# Patient Record
Sex: Male | Born: 1990 | Race: White | Hispanic: No | Marital: Married | Smoking: Never smoker
Health system: Southern US, Community
[De-identification: ages and names within clinical notes are randomized; demographics above are authoritative.]

## PROBLEM LIST (undated history)

## (undated) DIAGNOSIS — N200 Calculus of kidney: Secondary | ICD-10-CM

## (undated) DIAGNOSIS — E162 Hypoglycemia, unspecified: Secondary | ICD-10-CM

## (undated) DIAGNOSIS — R29898 Other symptoms and signs involving the musculoskeletal system: Secondary | ICD-10-CM

## (undated) DIAGNOSIS — K219 Gastro-esophageal reflux disease without esophagitis: Secondary | ICD-10-CM

## (undated) DIAGNOSIS — F419 Anxiety disorder, unspecified: Secondary | ICD-10-CM

## (undated) HISTORY — DX: Calculus of kidney: N20.0

## (undated) HISTORY — PX: VASECTOMY: SHX75

---

## 2010-08-06 ENCOUNTER — Emergency Department (HOSPITAL_COMMUNITY)
Admission: EM | Admit: 2010-08-06 | Discharge: 2010-08-06 | Disposition: A | Discharge status: Home / Self Care | Payer: Self-pay | Attending: Emergency Medicine | Admitting: Emergency Medicine

## 2010-08-06 ENCOUNTER — Emergency Department (HOSPITAL_COMMUNITY): Payer: Self-pay

## 2010-08-06 DIAGNOSIS — R509 Fever, unspecified: Secondary | ICD-10-CM | POA: Insufficient documentation

## 2010-08-06 DIAGNOSIS — R059 Cough, unspecified: Secondary | ICD-10-CM | POA: Insufficient documentation

## 2010-08-06 DIAGNOSIS — R05 Cough: Secondary | ICD-10-CM | POA: Insufficient documentation

## 2010-08-06 DIAGNOSIS — J189 Pneumonia, unspecified organism: Secondary | ICD-10-CM | POA: Insufficient documentation

## 2010-08-06 LAB — RAPID STREP SCREEN (MED CTR MEBANE ONLY): Streptococcus, Group A Screen (Direct): NEGATIVE

## 2010-11-12 ENCOUNTER — Emergency Department (HOSPITAL_COMMUNITY)
Admission: EM | Admit: 2010-11-12 | Discharge: 2010-11-12 | Disposition: A | Discharge status: Home / Self Care | Payer: Self-pay | Attending: Emergency Medicine | Admitting: Emergency Medicine

## 2010-11-12 DIAGNOSIS — J189 Pneumonia, unspecified organism: Secondary | ICD-10-CM | POA: Insufficient documentation

## 2010-12-11 ENCOUNTER — Emergency Department (HOSPITAL_COMMUNITY)
Admission: EM | Admit: 2010-12-11 | Discharge: 2010-12-11 | Disposition: A | Discharge status: Home / Self Care | Payer: Self-pay | Attending: Emergency Medicine | Admitting: Emergency Medicine

## 2010-12-11 DIAGNOSIS — T148XXA Other injury of unspecified body region, initial encounter: Secondary | ICD-10-CM

## 2010-12-11 DIAGNOSIS — X500XXA Overexertion from strenuous movement or load, initial encounter: Secondary | ICD-10-CM | POA: Insufficient documentation

## 2010-12-11 DIAGNOSIS — S139XXA Sprain of joints and ligaments of unspecified parts of neck, initial encounter: Secondary | ICD-10-CM | POA: Insufficient documentation

## 2010-12-11 DIAGNOSIS — Y9289 Other specified places as the place of occurrence of the external cause: Secondary | ICD-10-CM | POA: Insufficient documentation

## 2010-12-11 MED ORDER — NAPROXEN 500 MG PO TABS: 500.0000 mg | ORAL_TABLET | Freq: Two times a day (BID) | ORAL | Status: AC

## 2010-12-11 MED ORDER — CYCLOBENZAPRINE HCL 10 MG PO TABS: 10.0000 mg | ORAL_TABLET | Freq: Three times a day (TID) | ORAL | Status: AC | PRN

## 2010-12-11 NOTE — ED Provider Notes (Addendum)
History     Chief Complaint  Patient presents with  . Torticollis   HPI Comments: Pt reports waking up with a crick in his neck Sunday morning (2 days ago), later that day in the pool pt lifted up his girlfriend and has had increased right sided neck pain since. No direct injury to neck. No ha, back pain, numbness, tingling, or weakness. No fall or head injury. Pt has no other complaints.  Patient is a 20 y.o. male presenting with neck injury. The history is provided by the patient.  Neck Injury This is a new problem. The current episode started 2 days ago. The problem occurs constantly. The problem has not changed since onset.Pertinent negatives include no chest pain, no abdominal pain, no headaches and no shortness of breath. The symptoms are aggravated by twisting (twisting neck). The symptoms are relieved by nothing. Treatments tried: ibuprofen/advil. The treatment provided no relief.    History reviewed. No pertinent past medical history.  History reviewed. No pertinent past surgical history.  No family history on file.  History  Substance Use Topics  . Smoking status: Never Smoker   . Smokeless tobacco: Not on file  . Alcohol Use: No      Review of Systems  Constitutional: Negative for fatigue.  HENT: Negative for congestion, sinus pressure and ear discharge.   Eyes: Negative for discharge.  Respiratory: Negative for cough and shortness of breath.   Cardiovascular: Negative for chest pain.  Gastrointestinal: Negative for abdominal pain and diarrhea.  Genitourinary: Negative for frequency and hematuria.  Musculoskeletal: Negative for back pain.  Skin: Negative for rash.  Neurological: Negative for seizures and headaches.  Hematological: Negative.   Psychiatric/Behavioral: Negative for hallucinations.    Physical Exam  BP 138/77  Pulse 76  Temp(Src) 98.2 F (36.8 C) (Oral)  Resp 20  Ht 5\' 11"  (1.803 m)  Wt 150 lb (68.04 kg)  BMI 20.92 kg/m2  SpO2  100%  Physical Exam  Constitutional: He is oriented to person, place, and time. He appears well-developed.  HENT:  Head: Normocephalic and atraumatic.  Eyes: Conjunctivae and EOM are normal. No scleral icterus.  Neck: Normal range of motion. Neck supple. No thyromegaly present.       Right paraspinal and right trapezius tenderness, mild; FROM; no midline tenderness cervical, thoracic, or lumbar  Cardiovascular: Normal rate and regular rhythm.  Exam reveals no gallop and no friction rub.   No murmur heard. Pulmonary/Chest: No stridor. He has no wheezes. He has no rales. He exhibits no tenderness.  Abdominal: He exhibits no distension. There is no tenderness. There is no rebound.  Musculoskeletal: Normal range of motion. He exhibits no edema.  Lymphadenopathy:    He has no cervical adenopathy.  Neurological: He is oriented to person, place, and time. Coordination normal.  Skin: No rash noted. No erythema.  Psychiatric: He has a normal mood and affect. His behavior is normal.    ED Course  Procedures Written by Enos Fling acting as scribe for Dr. Estell Harpin.  MDM Muscular neck pain, d/c with muscle relaxants      Benny Lennert, MD 12/11/10 1323     I personally performed the services described in this documentation, which was scribed in my presence. The recorded information has been reviewed and considered. No att. providers found    Benny Lennert, MD 01/18/11 1408

## 2010-12-11 NOTE — ED Notes (Signed)
Pt reports waking up Sunday a.m with a "crick" in his neck.  Pt states that it worsened after lifting his girlfriend out of the pool.  Pt has limited movement left/right and flexion/extension.  Pt has been taking ibuprofen w/out relief.  CNS intact, all fingers bilaterally.  nad noted

## 2010-12-11 NOTE — ED Notes (Signed)
C/o neck pain after picking his girlfriend up out of the pool pain worse today. Occurred on Sunday. nad noted.

## 2016-10-24 ENCOUNTER — Emergency Department (HOSPITAL_COMMUNITY): Payer: No Typology Code available for payment source

## 2016-10-24 ENCOUNTER — Encounter (HOSPITAL_COMMUNITY): Payer: Self-pay

## 2016-10-24 ENCOUNTER — Emergency Department (HOSPITAL_COMMUNITY)
Admission: EM | Admit: 2016-10-24 | Discharge: 2016-10-24 | Disposition: A | Discharge status: Home / Self Care | Payer: No Typology Code available for payment source | Attending: Emergency Medicine | Admitting: Emergency Medicine

## 2016-10-24 DIAGNOSIS — R251 Tremor, unspecified: Secondary | ICD-10-CM | POA: Diagnosis present

## 2016-10-24 DIAGNOSIS — Z5181 Encounter for therapeutic drug level monitoring: Secondary | ICD-10-CM | POA: Diagnosis not present

## 2016-10-24 DIAGNOSIS — R079 Chest pain, unspecified: Secondary | ICD-10-CM | POA: Diagnosis not present

## 2016-10-24 DIAGNOSIS — F419 Anxiety disorder, unspecified: Secondary | ICD-10-CM | POA: Diagnosis not present

## 2016-10-24 DIAGNOSIS — R531 Weakness: Secondary | ICD-10-CM

## 2016-10-24 HISTORY — DX: Hypoglycemia, unspecified: E16.2

## 2016-10-24 HISTORY — DX: Anxiety disorder, unspecified: F41.9

## 2016-10-24 HISTORY — DX: Gastro-esophageal reflux disease without esophagitis: K21.9

## 2016-10-24 LAB — RAPID URINE DRUG SCREEN, HOSP PERFORMED
AMPHETAMINES: NOT DETECTED
BARBITURATES: NOT DETECTED
BENZODIAZEPINES: NOT DETECTED
Cocaine: NOT DETECTED
Opiates: NOT DETECTED
Tetrahydrocannabinol: NOT DETECTED

## 2016-10-24 LAB — CBC WITH DIFFERENTIAL/PLATELET
BASOS ABS: 0.1 10*3/uL (ref 0.0–0.1)
Basophils Relative: 1 %
Eosinophils Absolute: 0.1 10*3/uL (ref 0.0–0.7)
Eosinophils Relative: 1 %
HEMATOCRIT: 44.3 % (ref 39.0–52.0)
Hemoglobin: 15.4 g/dL (ref 13.0–17.0)
LYMPHS ABS: 1.7 10*3/uL (ref 0.7–4.0)
LYMPHS PCT: 29 %
MCH: 32.4 pg (ref 26.0–34.0)
MCHC: 34.8 g/dL (ref 30.0–36.0)
MCV: 93.1 fL (ref 78.0–100.0)
MONO ABS: 0.5 10*3/uL (ref 0.1–1.0)
MONOS PCT: 9 %
NEUTROS ABS: 3.7 10*3/uL (ref 1.7–7.7)
Neutrophils Relative %: 60 %
Platelets: 256 10*3/uL (ref 150–400)
RBC: 4.76 MIL/uL (ref 4.22–5.81)
RDW: 11.7 % (ref 11.5–15.5)
WBC: 6.1 10*3/uL (ref 4.0–10.5)

## 2016-10-24 LAB — COMPREHENSIVE METABOLIC PANEL
ALBUMIN: 4.4 g/dL (ref 3.5–5.0)
ALT: 39 U/L (ref 17–63)
ANION GAP: 7 (ref 5–15)
AST: 24 U/L (ref 15–41)
Alkaline Phosphatase: 70 U/L (ref 38–126)
BUN: 14 mg/dL (ref 6–20)
CHLORIDE: 102 mmol/L (ref 101–111)
CO2: 29 mmol/L (ref 22–32)
Calcium: 9.2 mg/dL (ref 8.9–10.3)
Creatinine, Ser: 0.99 mg/dL (ref 0.61–1.24)
GFR calc Af Amer: >60 mL/min (ref >60)
GFR calc non Af Amer: >60 mL/min (ref >60)
GLUCOSE: 105 mg/dL — AB (ref 65–99)
POTASSIUM: 4.2 mmol/L (ref 3.5–5.1)
SODIUM: 138 mmol/L (ref 135–145)
TOTAL PROTEIN: 7.2 g/dL (ref 6.5–8.1)
Total Bilirubin: 0.9 mg/dL (ref 0.3–1.2)

## 2016-10-24 LAB — URINALYSIS, ROUTINE W REFLEX MICROSCOPIC
Bilirubin Urine: NEGATIVE
GLUCOSE, UA: NEGATIVE mg/dL
HGB URINE DIPSTICK: NEGATIVE
KETONES UR: NEGATIVE mg/dL
LEUKOCYTES UA: NEGATIVE
Nitrite: NEGATIVE
PH: 7 (ref 5.0–8.0)
Protein, ur: NEGATIVE mg/dL
Specific Gravity, Urine: 1.02 (ref 1.005–1.030)

## 2016-10-24 NOTE — ED Provider Notes (Signed)
[facility]-EMERGENCY DEPT Provider Note   CSN: [id number] Arrival date & time: 10/24/16  [id number]     History   Chief Complaint Chief Complaint  Patient presents with  . Chest Pain    HPI Lawanna KobusShawn M Johal is a 26 y.o. male.  He presents for a constellation of symptoms.  He is currently taking doxycycline for possible tick fever.  He was seen and evaluated at another ED, 5 days ago after presenting for evaluation of nausea and diarrhea.  He was prescribed doxycycline and advised to use a clear liquid diet.  No testing was done.  He had had tick exposure, 7 tiny ticks attached to his body prior to that.  He presumes that they were deer ticks.  Currently he complains of intermittent sharp chest pain, which lasts about 20 minutes at a time.  He also has some numbness in his left hand and pain in the left forearm and upper arm.  The pain in his arm comes and goes.  The numbness is not persistent.  The patient states he feels anxious and nervous.  He cites associated symptoms of headache, and trembling.  He states that he has "hypoglycemic", and has to eat every 2 hours.  He takes BuSpar as needed symptoms of anxiety.  He has also been prescribed an antidepressant, but does not take it regularly.  He denies fever but has had some chills.  He denies dysuria, constipation, diarrhea, cough, back pain, abdominal pain, weakness or dizziness.  There are no other known modifying factors.  HPI  Past Medical History:  Diagnosis Date  . Acid reflux   . Anxiety   . Hypoglycemia     There are no active problems to display for this patient.   Past Surgical History:  Procedure Laterality Date  . VASECTOMY         Home Medications    Prior to Admission medications   Not on File    Family History No family history on file.  Social History Social History  Substance Use Topics  . Smoking status: Never Smoker  . Smokeless tobacco: Never Used  . Alcohol use Yes     Comment: occ     Allergies     Shellfish allergy   Review of Systems Review of Systems  All other systems reviewed and are negative.    Physical Exam Updated Vital Signs BP 132/86   Pulse 72   Temp 98.3 F (36.8 C) (Oral)   Resp 14   Ht 5\' 10"  (1.778 m)   Wt 81.6 kg (180 lb)   SpO2 99%   BMI 25.83 kg/m   Physical Exam  Constitutional: He is oriented to person, place, and time. He appears well-developed and well-nourished. No distress.  HENT:  Head: Normocephalic and atraumatic.  Right Ear: External ear normal.  Left Ear: External ear normal.  Eyes: Conjunctivae and EOM are normal. Pupils are equal, round, and reactive to light.  Neck: Normal range of motion and phonation normal. Neck supple.  Cardiovascular: Normal rate, regular rhythm and normal heart sounds.   Pulmonary/Chest: Effort normal and breath sounds normal. He exhibits no bony tenderness.  Abdominal: Soft. There is no tenderness.  Musculoskeletal: Normal range of motion.  Neurological: He is alert and oriented to person, place, and time. No cranial nerve deficit or sensory deficit. He exhibits normal muscle tone. Coordination normal.  No dysarthria aphasia or nystagmus.  Very mild light touch dysesthesia of the fingers of the left hand.  No  pronator drift.  No ataxia.  Skin: Skin is warm, dry and intact.  Psychiatric: His behavior is normal. Judgment and thought content normal.  Anxious, mild tremor noted.  Nursing note and vitals reviewed.    ED Treatments / Results  Labs (all labs ordered are listed, but only abnormal results are displayed) Labs Reviewed  COMPREHENSIVE METABOLIC PANEL - Abnormal; Notable for the following:       Result Value   Glucose, Bld 105 (*)    All other components within normal limits  CBC WITH DIFFERENTIAL/PLATELET  URINALYSIS, ROUTINE W REFLEX MICROSCOPIC  RAPID URINE DRUG SCREEN, HOSP PERFORMED  B. BURGDORFI ANTIBODIES    EKG  EKG Interpretation  Date/Time:  Thursday Oct 24 2016 07:26:01  EDT Ventricular Rate:  71 PR Interval:    QRS Duration: 85 QT Interval:  390 QTC Calculation: 424 R Axis:   64 Text Interpretation:  Sinus rhythm RSR' in V1 or V2, probably normal variant ST elev, probable normal early repol pattern Baseline wander in lead(s) I II aVR aVF No old tracing to compare Confirmed by Mancel Bale [phone]) on 10/24/2016 7:39:03 AM       Radiology Dg Chest 2 View  Result Date: 10/24/2016 CLINICAL DATA:  Cough. EXAM: CHEST  2 VIEW COMPARISON:  Radiographs of November 06, 2015. FINDINGS: The heart size and mediastinal contours are within normal limits. Both lungs are clear. No pneumothorax or pleural effusion is noted. The visualized skeletal structures are unremarkable. IMPRESSION: No active cardiopulmonary disease. Electronically Signed   By: Lupita Raider, M.D.   On: 10/24/2016 08:55   Ct Head Wo Contrast  Result Date: 10/24/2016 CLINICAL DATA:  TINGLING IN FINGERS SINCE 10/21/2016, C/O HEADACHE, STATES WAS BITTEN BY 7 TICKS 10/20/2016 EXAM: CT HEAD WITHOUT CONTRAST TECHNIQUE: Contiguous axial images were obtained from the base of the skull through the vertex without intravenous contrast. COMPARISON:  None. FINDINGS: Brain: No evidence of acute infarction, hemorrhage, hydrocephalus, extra-axial collection or mass lesion/mass effect. Vascular: No hyperdense vessel or unexpected calcification. Skull: Normal. Negative for fracture or focal lesion. Sinuses/Orbits: No acute finding. Other: None. IMPRESSION: Negative exam Electronically Signed   By: Norva Pavlov M.D.   On: 10/24/2016 09:09    Procedures Procedures (including critical care time)  Medications Ordered in ED Medications - No data to display   Initial Impression / Assessment and Plan / ED Course  I have reviewed the triage vital signs and the nursing notes.  Pertinent labs & imaging results that were available during my care of the patient were reviewed by me and considered in my medical decision making  (see chart for details).  Clinical Course as of Oct 25 829  Thu Oct 24, 2016  6045 Initial evaluation is consistent with subacute symptoms, which are atypical and not indicative of TIA, stroke, ACS or acute hypertensive urgency.  Patient desires evaluation of his symptoms with testing.  [EW]    Clinical Course User Index [EW] Mancel Bale, MD     Patient Vitals for the past 24 hrs:  BP Pulse Resp SpO2  10/24/16 1100 132/86 72 14 99 %  10/24/16 1030 121/80 62 10 98 %  10/24/16 1000 117/79 64 13 99 %  10/24/16 0930 125/82 64 16 100 %  10/24/16 0915 - 62 10 99 %  10/24/16 0900 (!) 129/91 81 19 94 %  10/24/16 0845 - (!) 57 10 99 %    At discharge- reevaluation with update and discussion. After initial assessment and treatment,  an updated evaluation reveals he states he is feeling better at this time and has no further complaints.  Findings discussed with the patient, and his wife, and all questions answered. Yun Gutierrez L     Final Clinical Impressions(s) / ED Diagnoses   Final diagnoses:  Nonspecific chest pain  Weakness  Anxiety    Nonspecific symptoms post GI illness, and tick bite.  Currently taking doxycycline.  History of anxiety, with medical signs and symptoms of anxiety disorder.  Doubt active take effect or disease, serious bacterial infection, metabolic instability, ACS, spinal myelopathy, or intracranial lesion.  Nursing Notes Reviewed/ Care Coordinated Applicable Imaging Reviewed Interpretation of Laboratory Data incorporated into ED treatment  The patient appears reasonably screened and/or stabilized for discharge and I doubt any other medical condition or other [facility] requiring further screening, evaluation, or treatment in the ED at this time prior to discharge.  Plan: Home Medications-continue usual medications, take BuSpar daily.; Home Treatments-rest, fluids; return here if the recommended treatment, does not improve the symptoms; Recommended follow up-PCP,  as needed   New Prescriptions There are no discharge medications for this patient.    Mancel Bale, MD 10/25/16 [phone]

## 2016-10-24 NOTE — Discharge Instructions (Signed)
The testing today was reassuring.  There is no sign of any brain or heart problems at this time.  You should get plenty of rest and drink a lot of fluids and try to eat 3 meals each day.  Continue taking the doxycycline, until gone.  With your primary care doctor for checkup next week.  Return here, if needed, for problems.

## 2016-10-24 NOTE — ED Triage Notes (Addendum)
Pt reports he had 7 ticks on him last week.  Reports had diarrhea and headache [facility]  He was put on doxycycline and zofran.  Pt says still nauseated, no vomiting, but reports chest pain that started today.  Pt also says he woke up at 5 am with intermittent numbness in left hand and forearm.  Also reports headache on left side.  Pt says the hand numbness started 3 days ago but comes and goes.

## 2016-10-25 LAB — B. BURGDORFI ANTIBODIES

## 2017-11-04 ENCOUNTER — Ambulatory Visit (HOSPITAL_COMMUNITY)
Admission: RE | Admit: 2017-11-04 | Discharge: 2017-11-04 | Disposition: A | Discharge status: Home / Self Care | Payer: Commercial Managed Care - PPO | Source: Ambulatory Visit | Attending: Internal Medicine | Admitting: Internal Medicine

## 2017-11-04 ENCOUNTER — Other Ambulatory Visit (HOSPITAL_COMMUNITY): Payer: Self-pay | Admitting: Internal Medicine

## 2017-11-04 DIAGNOSIS — N50811 Right testicular pain: Secondary | ICD-10-CM | POA: Insufficient documentation

## 2017-12-27 ENCOUNTER — Emergency Department (HOSPITAL_COMMUNITY): Payer: Commercial Managed Care - PPO

## 2017-12-27 ENCOUNTER — Other Ambulatory Visit: Payer: Self-pay

## 2017-12-27 ENCOUNTER — Encounter (HOSPITAL_COMMUNITY): Payer: Self-pay | Admitting: Emergency Medicine

## 2017-12-27 ENCOUNTER — Emergency Department (HOSPITAL_COMMUNITY)
Admission: EM | Admit: 2017-12-27 | Discharge: 2017-12-27 | Disposition: A | Discharge status: Home / Self Care | Payer: Commercial Managed Care - PPO | Attending: Emergency Medicine | Admitting: Emergency Medicine

## 2017-12-27 DIAGNOSIS — N200 Calculus of kidney: Secondary | ICD-10-CM | POA: Diagnosis not present

## 2017-12-27 DIAGNOSIS — R109 Unspecified abdominal pain: Secondary | ICD-10-CM | POA: Diagnosis present

## 2017-12-27 DIAGNOSIS — K760 Fatty (change of) liver, not elsewhere classified: Secondary | ICD-10-CM | POA: Diagnosis not present

## 2017-12-27 HISTORY — DX: Other symptoms and signs involving the musculoskeletal system: R29.898

## 2017-12-27 LAB — COMPREHENSIVE METABOLIC PANEL
ALBUMIN: 4.5 g/dL (ref 3.5–5.0)
ALK PHOS: 78 U/L (ref 38–126)
ALT: 66 U/L — ABNORMAL HIGH (ref 0–44)
ANION GAP: 10 (ref 5–15)
AST: 34 U/L (ref 15–41)
BILIRUBIN TOTAL: 0.6 mg/dL (ref 0.3–1.2)
BUN: 14 mg/dL (ref 6–20)
CALCIUM: 9.3 mg/dL (ref 8.9–10.3)
CO2: 27 mmol/L (ref 22–32)
Chloride: 103 mmol/L (ref 98–111)
Creatinine, Ser: 0.94 mg/dL (ref 0.61–1.24)
GFR calc non Af Amer: >60 mL/min (ref >60)
Glucose, Bld: 97 mg/dL (ref 70–99)
POTASSIUM: 3.8 mmol/L (ref 3.5–5.1)
SODIUM: 140 mmol/L (ref 135–145)
TOTAL PROTEIN: 7.5 g/dL (ref 6.5–8.1)

## 2017-12-27 LAB — URINALYSIS, ROUTINE W REFLEX MICROSCOPIC
BILIRUBIN URINE: NEGATIVE
GLUCOSE, UA: NEGATIVE mg/dL
Hgb urine dipstick: NEGATIVE
KETONES UR: NEGATIVE mg/dL
Leukocytes, UA: NEGATIVE
Nitrite: NEGATIVE
PH: 6 (ref 5.0–8.0)
Protein, ur: NEGATIVE mg/dL
SPECIFIC GRAVITY, URINE: 1.025 (ref 1.005–1.030)

## 2017-12-27 LAB — CBC WITH DIFFERENTIAL/PLATELET
BASOS PCT: 1 %
Basophils Absolute: 0.1 10*3/uL (ref 0.0–0.1)
EOS ABS: 0.1 10*3/uL (ref 0.0–0.7)
Eosinophils Relative: 2 %
HEMATOCRIT: 45 % (ref 39.0–52.0)
HEMOGLOBIN: 15.5 g/dL (ref 13.0–17.0)
LYMPHS ABS: 2.4 10*3/uL (ref 0.7–4.0)
Lymphocytes Relative: 31 %
MCH: 32.8 pg (ref 26.0–34.0)
MCHC: 34.4 g/dL (ref 30.0–36.0)
MCV: 95.3 fL (ref 78.0–100.0)
MONO ABS: 0.7 10*3/uL (ref 0.1–1.0)
MONOS PCT: 9 %
NEUTROS PCT: 57 %
Neutro Abs: 4.4 10*3/uL (ref 1.7–7.7)
Platelets: 274 10*3/uL (ref 150–400)
RBC: 4.72 MIL/uL (ref 4.22–5.81)
RDW: 11.8 % (ref 11.5–15.5)
WBC: 7.7 10*3/uL (ref 4.0–10.5)

## 2017-12-27 LAB — LIPASE, BLOOD: Lipase: 33 U/L (ref 11–51)

## 2017-12-27 MED ORDER — KETOROLAC TROMETHAMINE 30 MG/ML IJ SOLN
15.0000 mg | Freq: Once | INTRAMUSCULAR | Status: AC
  Administered 2017-12-27: 15 mg via INTRAVENOUS
  Filled 2017-12-27: qty 1

## 2017-12-27 MED ORDER — IBUPROFEN 600 MG PO TABS: 600.0000 mg | ORAL_TABLET | Freq: Four times a day (QID) | ORAL | 0 refills | Status: AC | PRN

## 2017-12-27 MED ORDER — HYDROCODONE-ACETAMINOPHEN 5-325 MG PO TABS
1.0000 | ORAL_TABLET | Freq: Once | ORAL | Status: AC
  Administered 2017-12-27: 1 via ORAL
  Filled 2017-12-27: qty 1

## 2017-12-27 MED ORDER — HYDROCODONE-ACETAMINOPHEN 5-325 MG PO TABS: 1.0000 | ORAL_TABLET | ORAL | 0 refills | Status: DC | PRN

## 2017-12-27 NOTE — Discharge Instructions (Addendum)
As discussed, your CT scan and labs do not give us the exact reason for your pain.  You do have a tiny stones within your right kidney but these should not be painful as they are not moving through your ureter.  Given your symptoms are worsened with movement and palpation, it is more likely for your pain to be from a musculoskeletal source.  Use the medications prescribed, do not drive within 4 hours of taking hydrocodone as this medication will make you drowsy.  Use a heating pad for 20 minutes several times daily to your right lower back.  As discussed, your CT scan also suggests "fatty liver" changes which again I do not believe is the source of your symptoms today.  However you should let your primary doctor know of this CT finding.

## 2017-12-27 NOTE — ED Provider Notes (Signed)
[facility] EMERGENCY DEPARTMENT Provider Note   CSN: [id number] Arrival date & time: 12/27/17  1135     History   Chief Complaint Chief Complaint  Patient presents with  . Flank Pain    HPI Tanner Davis is a 27 y.o. male with a history of acid reflux who had a testicular infection (epidydymitis ?)  Treated by his pcp several months ago with abx, presenting with right lower back pain which radiates into his right groin, but does not radiate to his scrotum. His pain is constant, aching and has been present for about 1 week and started radiating and has become more sharp and intense into the right groin last night. He denies dysuria, hematuria, penile discharge, urgency but has had subjective fevers with nausea and one episodes of emesis this am.  His pain is worsened with movement and certain positions.  He denies history of low back pain or any recent injury.  He has taken tylenol without relief of pain.  The history is provided by the patient.    Past Medical History:  Diagnosis Date  . Acid reflux   . Anxiety   . Growing pains   . Hypoglycemia     There are no active problems to display for this patient.   Past Surgical History:  Procedure Laterality Date  . VASECTOMY          Home Medications    Prior to Admission medications   Medication Sig Start Date End Date Taking? Authorizing Provider  HYDROcodone-acetaminophen (NORCO/VICODIN) 5-325 MG tablet Take 1 tablet by mouth every 4 (four) hours as needed. 12/27/17   Burgess Amor, PA-C  ibuprofen (ADVIL,MOTRIN) 600 MG tablet Take 1 tablet (600 mg total) by mouth every 6 (six) hours as needed. 12/27/17   Burgess Amor, PA-C    Family History Family History  Problem Relation Age of Onset  . Cancer Other   . Diabetes Other   . Diabetes Father   . Stroke Father     Social History Social History   Tobacco Use  . Smoking status: Never Smoker  . Smokeless tobacco: Never Used  Substance Use Topics  . Alcohol use:  Yes    Comment: occ  . Drug use: No     Allergies   Shellfish allergy   Review of Systems Review of Systems  Constitutional: Positive for fever.  HENT: Negative for congestion and sore throat.   Eyes: Negative.   Respiratory: Negative for chest tightness and shortness of breath.   Cardiovascular: Negative for chest pain.  Gastrointestinal: Positive for abdominal pain, nausea and vomiting.  Genitourinary: Positive for flank pain. Negative for discharge, dysuria, frequency, hematuria, scrotal swelling, testicular pain and urgency.  Musculoskeletal: Positive for back pain. Negative for arthralgias, joint swelling and neck pain.  Skin: Negative.  Negative for rash and wound.  Neurological: Negative for dizziness, weakness, light-headedness, numbness and headaches.  Psychiatric/Behavioral: Negative.      Physical Exam Updated Vital Signs BP 126/89 (BP Location: Right Arm)   Pulse 69   Temp 98.2 F (36.8 C) (Oral)   Resp 17   Ht 5\' 11"  (1.803 m)   Wt 86.2 kg (190 lb)   SpO2 98%   BMI 26.50 kg/m   Physical Exam  Constitutional: He appears well-developed and well-nourished.  HENT:  Head: Normocephalic and atraumatic.  Eyes: Conjunctivae are normal.  Neck: Normal range of motion.  Cardiovascular: Normal rate, regular rhythm, normal heart sounds and intact distal pulses.  Pulmonary/Chest: Effort  normal and breath sounds normal. He has no wheezes.  Abdominal: Soft. Bowel sounds are normal. He exhibits no distension. There is tenderness in the right lower quadrant. There is no rigidity, no rebound, no guarding, no CVA tenderness and no tenderness at McBurney's point. Hernia confirmed negative in the right inguinal area.  ttp right lower back and right lower pelvis. No guarding, no palpable mass. Positive psoas right.   Genitourinary: Testes normal. Right testis shows no mass, no swelling and no tenderness.  Genitourinary Comments: Chaperone was present during exam.     Musculoskeletal: Normal range of motion.  Neurological: He is alert.  Skin: Skin is warm and dry.  Psychiatric: He has a normal mood and affect.  Nursing note and vitals reviewed.    ED Treatments / Results  Labs (all labs ordered are listed, but only abnormal results are displayed) Labs Reviewed  COMPREHENSIVE METABOLIC PANEL - Abnormal; Notable for the following components:      Result Value   ALT 66 (*)    All other components within normal limits  URINALYSIS, ROUTINE W REFLEX MICROSCOPIC  CBC WITH DIFFERENTIAL/PLATELET  LIPASE, BLOOD    EKG None  Radiology Ct Renal Stone Study  Result Date: 12/27/2017 CLINICAL DATA:  27 year old male with a history of right-sided flank pain EXAM: CT ABDOMEN AND PELVIS WITHOUT CONTRAST TECHNIQUE: Multidetector CT imaging of the abdomen and pelvis was performed following the standard protocol without IV contrast. COMPARISON:  08/16/2014 FINDINGS: Lower chest: No acute abnormality. Hepatobiliary: Diffusely decreased attenuation of liver parenchyma. Greatest cranial caudal span of the right liver measures 18.7 cm, increased from the comparison CT. Contracted gallbladder. Pancreas: Unremarkable pancreas Spleen: Unremarkable spleen Adrenals/Urinary Tract: Unremarkable adrenal glands. Right kidney demonstrates multiple small stones within the collecting system largest measures 4 mm in the posterior calyx. Unremarkable right ureter. No hydronephrosis. Left kidney demonstrates no hydronephrosis or nephrolithiasis. Unremarkable course of the left ureter. Urinary bladder decompressed. Stomach/Bowel: Unremarkable stomach. Unremarkable small bowel. No transition point. Normal appendix. Moderate stool burden. No focal inflammatory changes of the colon. Vascular/Lymphatic: No significant atherosclerotic changes. No adenopathy of the retroperitoneal region or abdomen. Reproductive: Unremarkable Other: No ventral wall hernia. Musculoskeletal: No acute displaced  fracture. No significant degenerative changes. IMPRESSION: No acute CT finding. Nonobstructive right-sided nephrolithiasis within the collecting system. No evidence of right ureteral stone or hydronephrosis. Liver steatosis and hepatomegaly. Electronically Signed   By: Gilmer MorJaime  Wagner D.O.   On: 12/27/2017 13:52    Procedures Procedures (including critical care time)  Medications Ordered in ED Medications  ketorolac (TORADOL) 30 MG/ML injection 15 mg (15 mg Intravenous Given 12/27/17 1603)  HYDROcodone-acetaminophen (NORCO/VICODIN) 5-325 MG per tablet 1 tablet (1 tablet Oral Given 12/27/17 1603)     Initial Impression / Assessment and Plan / ED Course  I have reviewed the triage vital signs and the nursing notes.  Pertinent labs & imaging results that were available during my care of the patient were reviewed by me and considered in my medical decision making (see chart for details).     Pt with right lower back pain and right lower pelvic pain, labs and CT imaging without obvious source, urine negative for infection or blood, he has renal stones but no ureteral stones present. Reproducible pain felt less likely due to intra abdominal process and more likely musculoskeletal.  discussed results of CT and liver findings. Pt states had general physical exam 2 weeks ago with pcp, awaiting lab results, unsure if cholesterol was screened.  He  endorses rare etoh use.  Advised pt to inform pcp of CT liver findings for close f/u care/monitoring.  Pt understands and agrees with this. Also discussed renal stones and what to expect with passage.  Pt placed on hydrocodone and ibuprofen for pain relief, heat tx, f/u with pcp in 1 week if sx not improved.  The patient appears reasonably screened and/or stabilized for discharge and I doubt any other medical condition or other [facility] requiring further screening, evaluation, or treatment in the ED at this time prior to discharge.   Final Clinical Impressions(s) / ED  Diagnoses   Final diagnoses:  Right flank pain  Renal stones  Hepatic steatosis    ED Discharge Orders        Ordered    HYDROcodone-acetaminophen (NORCO/VICODIN) 5-325 MG tablet  Every 4 hours PRN     12/27/17 1605    ibuprofen (ADVIL,MOTRIN) 600 MG tablet  Every 6 hours PRN     12/27/17 1605       Burgess Amor, PA-C 12/28/17 4098    Samuel Jester, DO 12/31/17 1237

## 2017-12-27 NOTE — ED Triage Notes (Signed)
Pt reports low back pain for several days prior to groin pain   Works as a [employer]

## 2017-12-27 NOTE — ED Triage Notes (Addendum)
Patient c/o right flank-tolower back pain that radiates into right lower groin. Per patient nausea and vomiting. Denies any diarrhea, fevers, or urinary symptoms. Patient reports taking tramadol last night, that he was prescribed recently for "growing pains", with some relief.

## 2018-04-05 ENCOUNTER — Emergency Department (HOSPITAL_COMMUNITY)
Admission: EM | Admit: 2018-04-05 | Discharge: 2018-04-05 | Disposition: A | Discharge status: Home / Self Care | Payer: Commercial Managed Care - PPO | Attending: Emergency Medicine | Admitting: Emergency Medicine

## 2018-04-05 ENCOUNTER — Encounter (HOSPITAL_COMMUNITY): Payer: Self-pay | Admitting: Emergency Medicine

## 2018-04-05 ENCOUNTER — Other Ambulatory Visit: Payer: Self-pay

## 2018-04-05 ENCOUNTER — Emergency Department (HOSPITAL_COMMUNITY): Payer: Commercial Managed Care - PPO

## 2018-04-05 DIAGNOSIS — Z79899 Other long term (current) drug therapy: Secondary | ICD-10-CM | POA: Diagnosis not present

## 2018-04-05 DIAGNOSIS — R112 Nausea with vomiting, unspecified: Secondary | ICD-10-CM | POA: Diagnosis present

## 2018-04-05 DIAGNOSIS — K529 Noninfective gastroenteritis and colitis, unspecified: Secondary | ICD-10-CM | POA: Diagnosis not present

## 2018-04-05 LAB — CBC
HEMATOCRIT: 48 % (ref 39.0–52.0)
HEMOGLOBIN: 15.8 g/dL (ref 13.0–17.0)
MCH: 31.3 pg (ref 26.0–34.0)
MCHC: 32.9 g/dL (ref 30.0–36.0)
MCV: 95 fL (ref 80.0–100.0)
Platelets: 280 10*3/uL (ref 150–400)
RBC: 5.05 MIL/uL (ref 4.22–5.81)
RDW: 11.5 % (ref 11.5–15.5)
WBC: 10.3 10*3/uL (ref 4.0–10.5)
nRBC: 0 % (ref 0.0–0.2)

## 2018-04-05 LAB — URINALYSIS, ROUTINE W REFLEX MICROSCOPIC
BILIRUBIN URINE: NEGATIVE
GLUCOSE, UA: NEGATIVE mg/dL
HGB URINE DIPSTICK: NEGATIVE
Ketones, ur: NEGATIVE mg/dL
Leukocytes, UA: NEGATIVE
Nitrite: NEGATIVE
PH: 6 (ref 5.0–8.0)
Protein, ur: NEGATIVE mg/dL
SPECIFIC GRAVITY, URINE: 1.02 (ref 1.005–1.030)

## 2018-04-05 LAB — COMPREHENSIVE METABOLIC PANEL
ALBUMIN: 4.7 g/dL (ref 3.5–5.0)
ALK PHOS: 83 U/L (ref 38–126)
ALT: 52 U/L — ABNORMAL HIGH (ref 0–44)
AST: 39 U/L (ref 15–41)
Anion gap: 8 (ref 5–15)
BILIRUBIN TOTAL: 1.8 mg/dL — AB (ref 0.3–1.2)
BUN: 10 mg/dL (ref 6–20)
CO2: 26 mmol/L (ref 22–32)
Calcium: 9 mg/dL (ref 8.9–10.3)
Chloride: 102 mmol/L (ref 98–111)
Creatinine, Ser: 0.97 mg/dL (ref 0.61–1.24)
GFR calc Af Amer: >60 mL/min (ref >60)
GFR calc non Af Amer: >60 mL/min (ref >60)
GLUCOSE: 110 mg/dL — AB (ref 70–99)
POTASSIUM: 4.3 mmol/L (ref 3.5–5.1)
Sodium: 136 mmol/L (ref 135–145)
TOTAL PROTEIN: 7.4 g/dL (ref 6.5–8.1)

## 2018-04-05 LAB — LIPASE, BLOOD: Lipase: 58 U/L — ABNORMAL HIGH (ref 11–51)

## 2018-04-05 MED ORDER — AZITHROMYCIN 250 MG PO TABS: 500.0000 mg | ORAL_TABLET | Freq: Every day | ORAL | 0 refills | Status: DC

## 2018-04-05 MED ORDER — SODIUM CHLORIDE 0.9 % IV BOLUS
1000.0000 mL | Freq: Once | INTRAVENOUS | Status: AC
  Administered 2018-04-05: 1000 mL via INTRAVENOUS

## 2018-04-05 MED ORDER — ONDANSETRON HCL 4 MG PO TABS: 4.0000 mg | ORAL_TABLET | Freq: Four times a day (QID) | ORAL | 0 refills | Status: DC | PRN

## 2018-04-05 MED ORDER — DICYCLOMINE HCL 20 MG PO TABS: 20.0000 mg | ORAL_TABLET | Freq: Two times a day (BID) | ORAL | 0 refills | Status: DC | PRN

## 2018-04-05 MED ORDER — ONDANSETRON HCL 4 MG/2ML IJ SOLN
4.0000 mg | Freq: Once | INTRAMUSCULAR | Status: AC
  Administered 2018-04-05: 4 mg via INTRAVENOUS
  Filled 2018-04-05: qty 2

## 2018-04-05 NOTE — Discharge Instructions (Signed)
Return immediately for worsening abdominal pain, vomiting and not able to tolerate liquid intake, blood in stool or vomit or for any concerns.  If your symptoms are not improving, take antibiotic as prescribed.  Follow-up with your primary physician.

## 2018-04-05 NOTE — ED Triage Notes (Addendum)
Pt states he awoke around 2am with N,V,D and generalized body aches. Pt also states he has had a cough for approximately 3 weeks.

## 2018-04-05 NOTE — ED Notes (Signed)
Patient transported to X-ray 

## 2018-04-05 NOTE — ED Provider Notes (Signed)
[facility] EMERGENCY DEPARTMENT Provider Note   CSN: [id number] Arrival date & time: 04/05/18  1900     History   Chief Complaint Chief Complaint  Patient presents with  . Nausea and Vomiting    HPI Tanner Davis is a 27 y.o. male.  HPI Patient woke this morning with generalized body aches, multiple episodes of vomiting and diarrhea.  No blood in the vomit or stool.  Had generalized malaise but no definite fever or chills.  Complains of generalized abdominal pain.  No recent foreign travel.  No known ingestion of food contaminants.  No sick contacts.  Patient states he has had 3 weeks of nonproductive cough. Past Medical History:  Diagnosis Date  . Acid reflux   . Anxiety   . Growing pains   . Hypoglycemia     There are no active problems to display for this patient.   Past Surgical History:  Procedure Laterality Date  . VASECTOMY          Home Medications    Prior to Admission medications   Medication Sig Start Date End Date Taking? Authorizing Provider  azithromycin (ZITHROMAX) 250 MG tablet Take 2 tablets (500 mg total) by mouth daily. 04/05/18   Loren Racer, MD  dicyclomine (BENTYL) 20 MG tablet Take 1 tablet (20 mg total) by mouth 2 (two) times daily as needed for spasms. 04/05/18   Loren Racer, MD  HYDROcodone-acetaminophen (NORCO/VICODIN) 5-325 MG tablet Take 1 tablet by mouth every 4 (four) hours as needed. 12/27/17   Burgess Amor, PA-C  ibuprofen (ADVIL,MOTRIN) 600 MG tablet Take 1 tablet (600 mg total) by mouth every 6 (six) hours as needed. 12/27/17   Burgess Amor, PA-C  ondansetron (ZOFRAN) 4 MG tablet Take 1 tablet (4 mg total) by mouth every 6 (six) hours as needed for nausea or vomiting. 04/05/18   Loren Racer, MD    Family History Family History  Problem Relation Age of Onset  . Cancer Other   . Diabetes Other   . Diabetes Father   . Stroke Father     Social History Social History   Tobacco Use  . Smoking status: Never Smoker    . Smokeless tobacco: Never Used  Substance Use Topics  . Alcohol use: Yes    Comment: occ  . Drug use: No     Allergies   Shellfish allergy   Review of Systems Review of Systems  Constitutional: Positive for fatigue. Negative for fever.  Respiratory: Positive for cough. Negative for shortness of breath and wheezing.   Cardiovascular: Negative for chest pain.  Gastrointestinal: Positive for abdominal pain, diarrhea, nausea and vomiting. Negative for constipation.  Genitourinary: Negative for flank pain, frequency and hematuria.  Musculoskeletal: Positive for myalgias. Negative for back pain, neck pain and neck stiffness.  Skin: Negative for rash and wound.  Neurological: Negative for dizziness, weakness, light-headedness, numbness and headaches.  All other systems reviewed and are negative.    Physical Exam Updated Vital Signs BP 120/82 (BP Location: Left Arm)   Pulse 75   Temp 99.1 F (37.3 C)   Resp 18   Ht 5\' 10"  (1.778 m)   Wt 88.5 kg   SpO2 100%   BMI 27.98 kg/m   Physical Exam  Constitutional: He is oriented to person, place, and time. He appears well-developed and well-nourished. No distress.  HENT:  Head: Normocephalic and atraumatic.  Mouth/Throat: Oropharynx is clear and moist. No oropharyngeal exudate.  Eyes: Pupils are equal, round, and reactive  to light. EOM are normal.  Neck: Normal range of motion. Neck supple. No JVD present.  Cardiovascular: Normal rate and regular rhythm. Exam reveals no gallop and no friction rub.  No murmur heard. Pulmonary/Chest: Effort normal and breath sounds normal. No stridor. No respiratory distress. He has no wheezes. He has no rales. He exhibits no tenderness.  Abdominal: Soft. Bowel sounds are normal. There is tenderness. There is no rebound and no guarding.  Mild diffuse abdominal tenderness to palpation without rebound or guarding.  Musculoskeletal: Normal range of motion. He exhibits no edema or tenderness.  No  midline thoracic or lumbar tenderness.  Patient does have diffuse thoracic and lumbar muscular tenderness to palpation.  Lymphadenopathy:    He has no cervical adenopathy.  Neurological: He is alert and oriented to person, place, and time.  Moving all extremities without focal deficit.  Sensation intact.  Skin: Skin is warm and dry. No rash noted. He is not diaphoretic. No erythema.  Psychiatric: He has a normal mood and affect. His behavior is normal.  Nursing note and vitals reviewed.    ED Treatments / Results  Labs (all labs ordered are listed, but only abnormal results are displayed) Labs Reviewed  LIPASE, BLOOD - Abnormal; Notable for the following components:      Result Value   Lipase 58 (*)    All other components within normal limits  COMPREHENSIVE METABOLIC PANEL - Abnormal; Notable for the following components:   Glucose, Bld 110 (*)    ALT 52 (*)    Total Bilirubin 1.8 (*)    All other components within normal limits  CBC  URINALYSIS, ROUTINE W REFLEX MICROSCOPIC    EKG None  Radiology Dg Chest 2 View  Result Date: 04/05/2018 CLINICAL DATA:  Nausea and vomiting EXAM: CHEST - 2 VIEW COMPARISON:  10/24/2016 FINDINGS: The heart size and mediastinal contours are within normal limits. Both lungs are clear. The visualized skeletal structures are unremarkable. IMPRESSION: No active cardiopulmonary disease. Electronically Signed   By: Alcide Clever M.D.   On: 04/05/2018 20:59    Procedures Procedures (including critical care time)  Medications Ordered in ED Medications  sodium chloride 0.9 % bolus 1,000 mL (0 mLs Intravenous Stopped 04/05/18 2106)  ondansetron (ZOFRAN) injection 4 mg (4 mg Intravenous Given 04/05/18 2005)     Initial Impression / Assessment and Plan / ED Course  I have reviewed the triage vital signs and the nursing notes.  Pertinent labs & imaging results that were available during my care of the patient were reviewed by me and considered in my  medical decision making (see chart for details).     Abdominal exam is benign.  Normal white blood cell count.  Suspect viral gastroenteritis though bacterial enteritis is certainly possibility.  Will treat symptomatically.  Advised patient to take antibiotic if his symptoms persist.  Strict return precautions have been given.  Final Clinical Impressions(s) / ED Diagnoses   Final diagnoses:  Gastroenteritis    ED Discharge Orders         Ordered    ondansetron (ZOFRAN) 4 MG tablet  Every 6 hours PRN     04/05/18 2218    dicyclomine (BENTYL) 20 MG tablet  2 times daily PRN     04/05/18 2218    azithromycin (ZITHROMAX) 250 MG tablet  Daily     04/05/18 2218           Loren Racer, MD 04/05/18 2219

## 2018-09-17 IMAGING — CT CT RENAL STONE PROTOCOL
2 of 4 series · 17 of 46 positions shown, 19 images · non-contrast
Comparison: 08/16/2014

CLINICAL DATA: 27-year-old male with a history of right-sided flank
pain

EXAM:
CT ABDOMEN AND PELVIS WITHOUT CONTRAST
TECHNIQUE: Multidetector CT imaging of the abdomen and pelvis was performed
following the standard protocol without IV contrast.

[Series 2: axial st · axial · 0.73mm/px · z∈[-612,-176]mm · 14 of 95 slices shown, 16 images]
[im 4/95  soft-tissue]
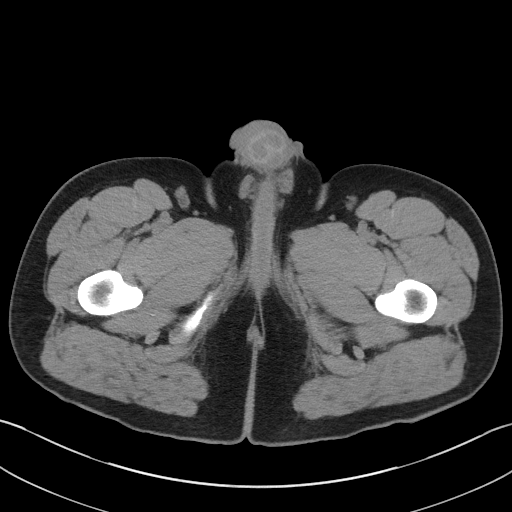
[im 4/95  bone]
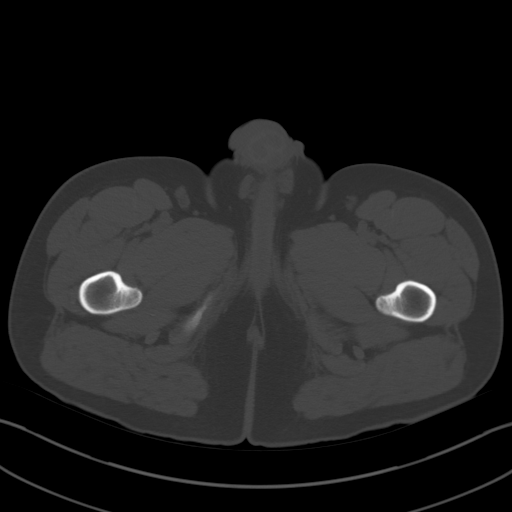
[im 12/95  soft-tissue]
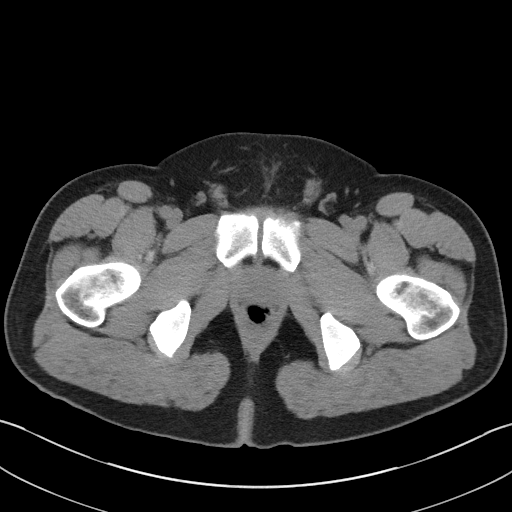
[im 19/95  soft-tissue]
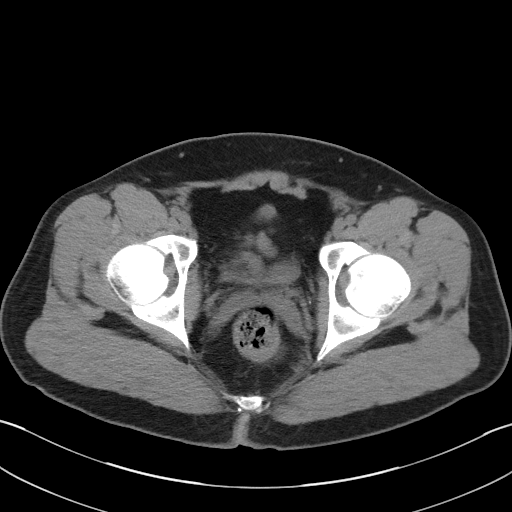
[im 27/95  soft-tissue]
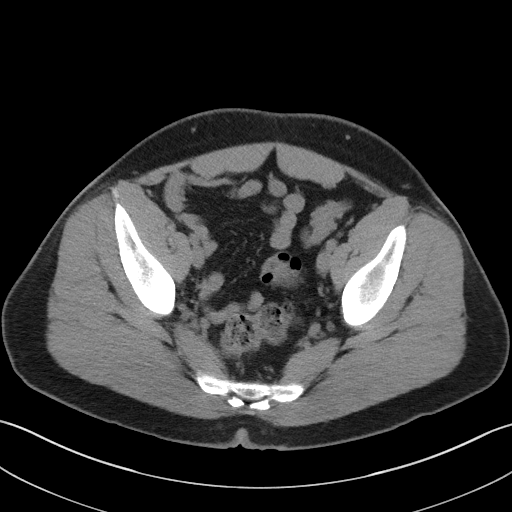
[im 31/95  soft-tissue]
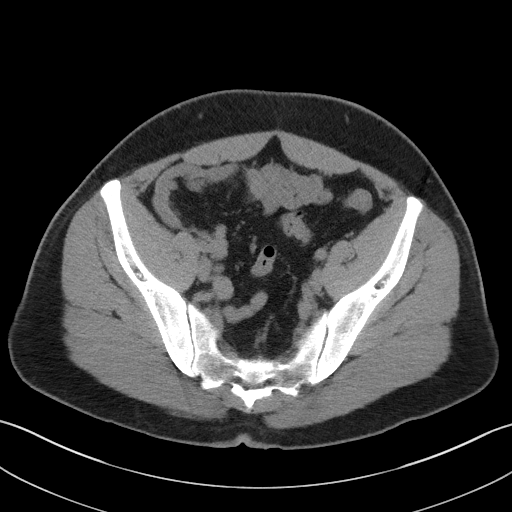
[im 38/95  soft-tissue]
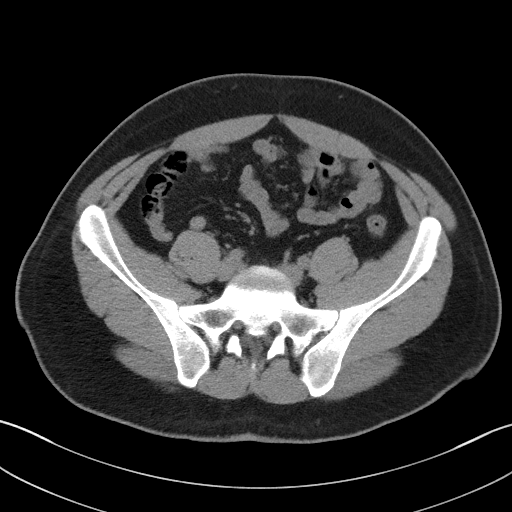
[im 46/95  soft-tissue]
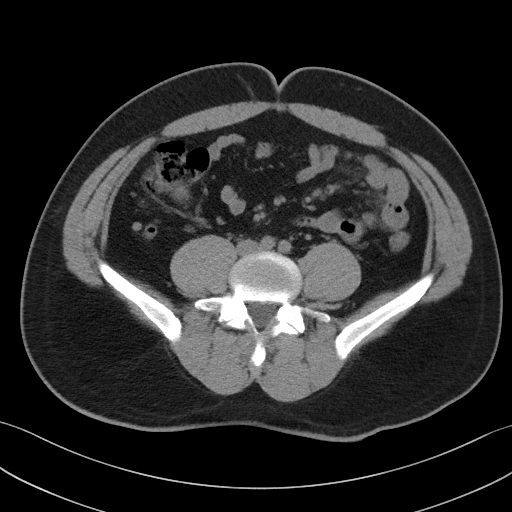
[im 49/95  soft-tissue]
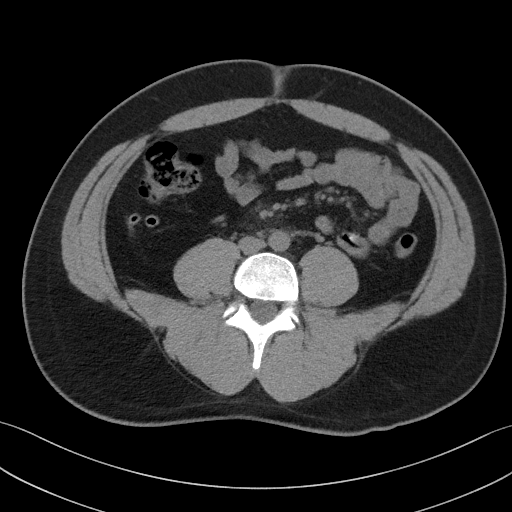
[im 57/95  soft-tissue]
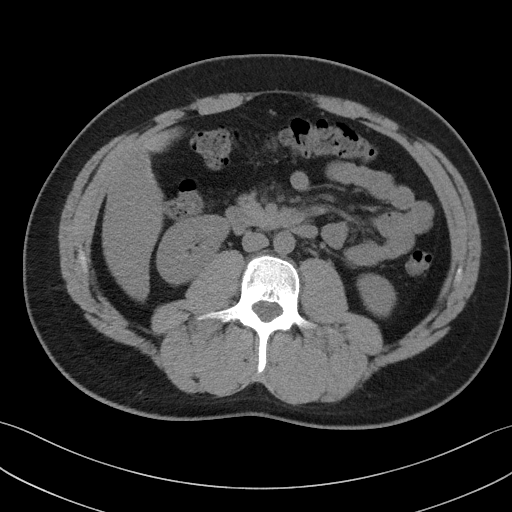
[im 57/95  bone]
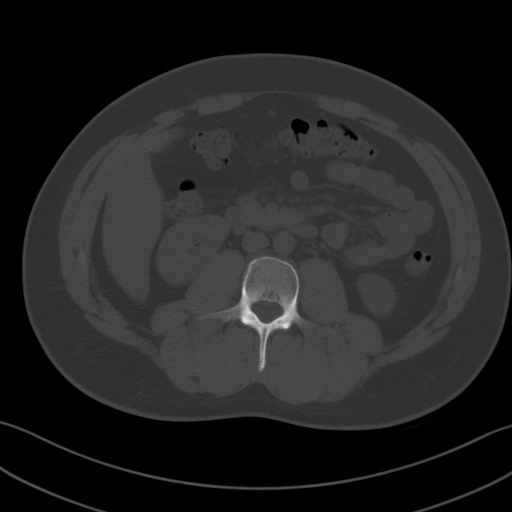
[im 64/95  soft-tissue]
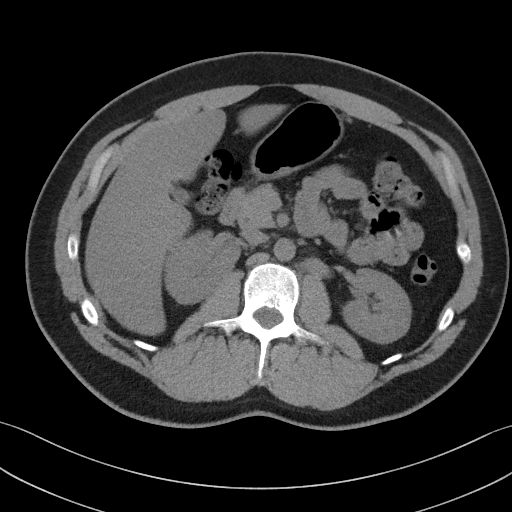
[im 72/95  soft-tissue]
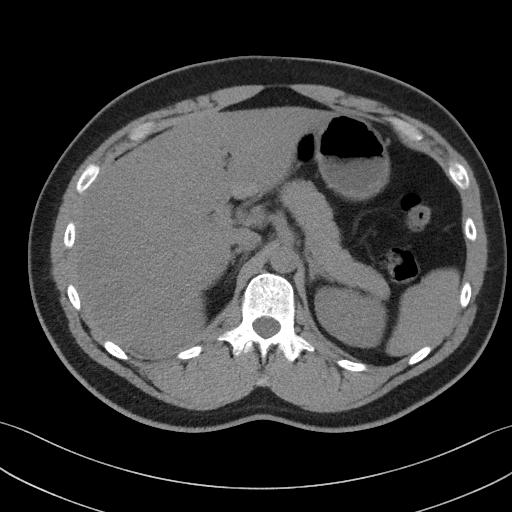
[im 76/95  soft-tissue]
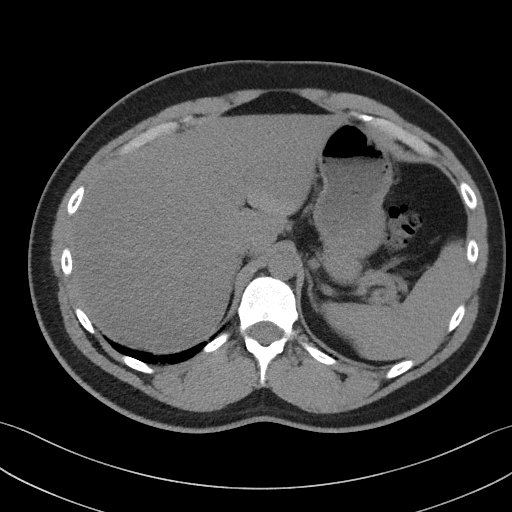
[im 83/95  soft-tissue]
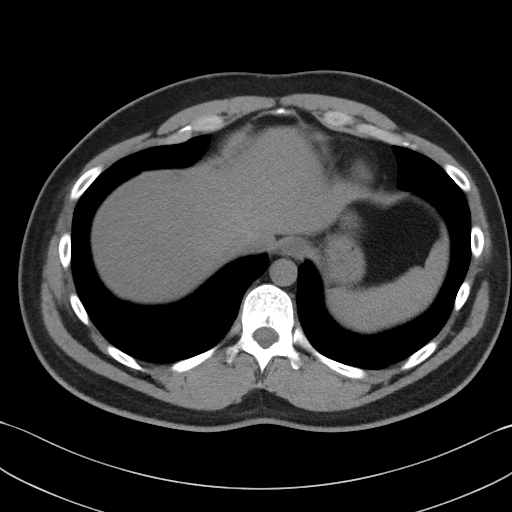
[im 91/95  soft-tissue]
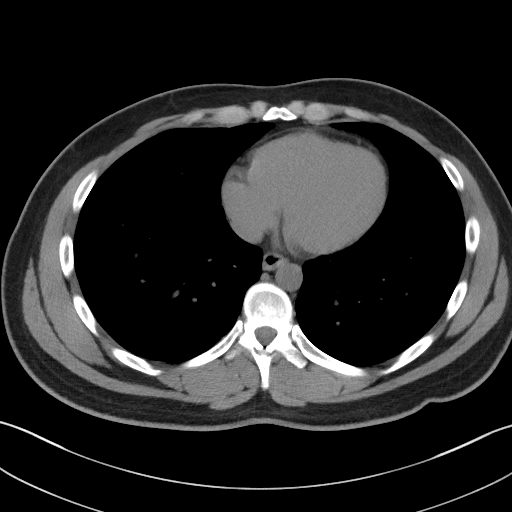

[Series 5: coronal st · coronal · 0.85mm/px · 3 of 106 slices shown]
[im 36/106  soft-tissue]
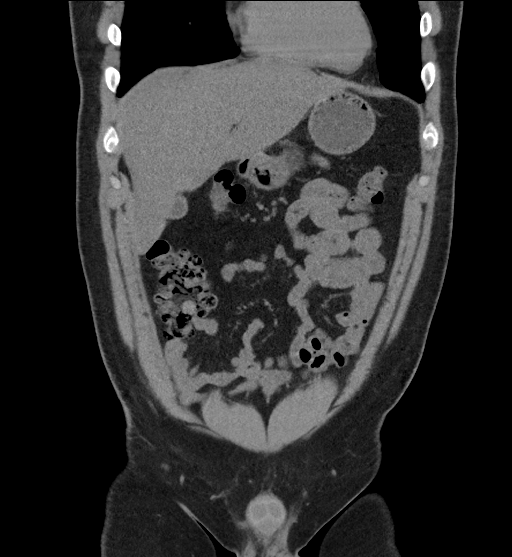
[im 47/106  soft-tissue]
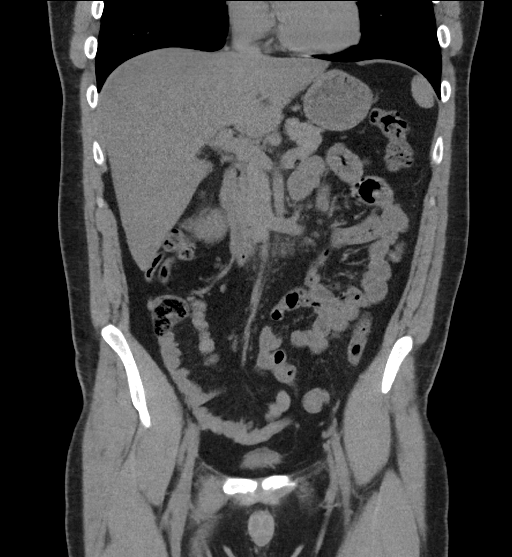
[im 59/106  soft-tissue]
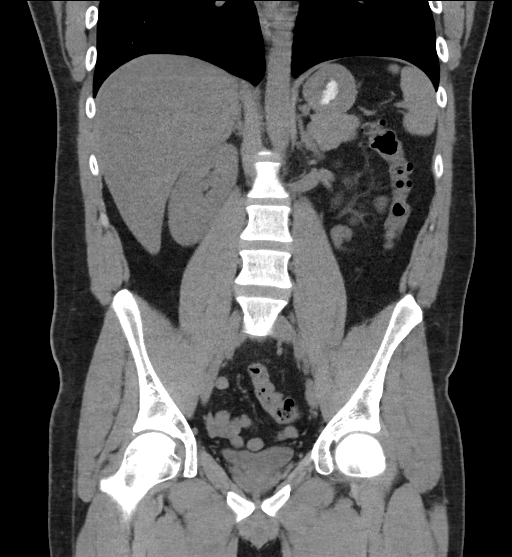

[17 of 46 positions shown; findings below may reference images not displayed]

FINDINGS: Lower chest: No acute abnormality.

Hepatobiliary: Diffusely decreased attenuation of liver parenchyma.
Greatest cranial caudal span of the right liver measures 18.7 cm,
increased from the comparison CT. Contracted gallbladder.

Pancreas: Unremarkable pancreas

Spleen: Unremarkable spleen

Adrenals/Urinary Tract: Unremarkable adrenal glands.

Right kidney demonstrates multiple small stones within the
collecting system largest measures 4 mm in the posterior calyx.
Unremarkable right ureter. No hydronephrosis.

Left kidney demonstrates no hydronephrosis or nephrolithiasis.
Unremarkable course of the left ureter.

Urinary bladder decompressed.

Stomach/Bowel: Unremarkable stomach. Unremarkable small bowel. No
transition point. Normal appendix. Moderate stool burden. No focal
inflammatory changes of the colon.

Vascular/Lymphatic: No significant atherosclerotic changes. No
adenopathy of the retroperitoneal region or abdomen.

Reproductive: Unremarkable

Other: No ventral wall hernia.

Musculoskeletal: No acute displaced fracture. No significant
degenerative changes.
IMPRESSION: No acute CT finding.

Nonobstructive right-sided nephrolithiasis within the collecting
system. No evidence of right ureteral stone or hydronephrosis.

Liver steatosis and hepatomegaly.

## 2020-08-14 NOTE — H&P (Signed)
H&P  Chief Complaint: Kidney stone  History of Present Illness: 30 year old male apparently with prior history of multiple kidney stones, all having passed spontaneously, comes in today for recent history of right flank and lower quadrant pain, nausea, vomiting, lower urinary tract symptoms.  He went to the emergency room in [place], [place] 2 days ago.  CT scan revealed a 3 mm right distal ureteral stone with mild hydronephrosis.  He passed the stone about 3 hours ago now.  He is now pain-free.  He has not had prior urologic evaluation for the stones.  He has no dietary modification history.  Past Medical History:  Diagnosis Date  . Acid reflux   . Anxiety   . Growing pains   . Hypoglycemia     Past Surgical History:  Procedure Laterality Date  . VASECTOMY      Home Medications:  Allergies as of 08/15/2020      Reactions   Shellfish Allergy       Medication List       Accurate as of August 14, 2020  9:35 PM. If you have any questions, ask your nurse or doctor.        azithromycin 250 MG tablet Commonly known as: ZITHROMAX Take 2 tablets (500 mg total) by mouth daily.   dicyclomine 20 MG tablet Commonly known as: BENTYL Take 1 tablet (20 mg total) by mouth 2 (two) times daily as needed for spasms.   HYDROcodone-acetaminophen 5-325 MG tablet Commonly known as: NORCO/VICODIN Take 1 tablet by mouth every 4 (four) hours as needed.   ibuprofen 600 MG tablet Commonly known as: ADVIL Take 1 tablet (600 mg total) by mouth every 6 (six) hours as needed.   ondansetron 4 MG tablet Commonly known as: ZOFRAN Take 1 tablet (4 mg total) by mouth every 6 (six) hours as needed for nausea or vomiting.       Allergies:  Allergies  Allergen Reactions  . Shellfish Allergy     Family History  Problem Relation Age of Onset  . Cancer Other   . Diabetes Other   . Diabetes Father   . Stroke Father     Social History:  reports that he has never smoked. He has never used  smokeless tobacco. He reports current alcohol use. He reports that he does not use drugs.  ROS: A complete review of systems was performed.  All systems are negative except for pertinent findings as noted.  Physical Exam:  Vital signs in last 24 hours: There were no vitals taken for this visit. Constitutional:  Alert and oriented, No acute distress Cardiovascular: Regular rate  Respiratory: Normal respiratory effort GI: Abdomen is soft, nontender, nondistended, no abdominal masses. No CVAT.  Genitourinary: Normal male phallus, testes are descended bilaterally and non-tender and without masses, scrotum is normal in appearance without lesions or masses, perineum is normal on inspection. Lymphatic: No lymphadenopathy Neurologic: Grossly intact, no focal deficits Psychiatric: Normal mood and affect  I have reviewed prior pt notes  I have reviewed notes from referring/previous physicians  I have reviewed urinalysis results  I have independently reviewed prior imaging--most recent CT images were reviewed with the patient.  He had perhaps 1 punctate stone in each kidney.  He had a 3 mm right UVJ stone.    Impression/Assessment:  Right ureteral stone, passed  History of multiple ureteral calculi  Plan:  1.  I will send the patient home with a 24-hour urine request  2.  I will mail  him results, instructions on dietary modification if necessary and follow-up.

## 2020-08-15 ENCOUNTER — Other Ambulatory Visit: Payer: Self-pay

## 2020-08-15 ENCOUNTER — Ambulatory Visit (INDEPENDENT_AMBULATORY_CARE_PROVIDER_SITE_OTHER): Payer: Commercial Managed Care - PPO | Admitting: Urology

## 2020-08-15 ENCOUNTER — Encounter: Payer: Self-pay | Admitting: Urology

## 2020-08-15 VITALS — BP 139/79 | HR 84 | Temp 98.7°F | Ht 70.0 in | Wt 195.0 lb

## 2020-08-15 DIAGNOSIS — N200 Calculus of kidney: Secondary | ICD-10-CM

## 2020-08-15 LAB — URINALYSIS, ROUTINE W REFLEX MICROSCOPIC
Bilirubin, UA: NEGATIVE
Glucose, UA: NEGATIVE
Ketones, UA: NEGATIVE
Leukocytes,UA: NEGATIVE
Nitrite, UA: NEGATIVE
Protein,UA: NEGATIVE
RBC, UA: NEGATIVE
Specific Gravity, UA: 1.02 (ref 1.005–1.030)
Urobilinogen, Ur: 0.2 mg/dL (ref 0.2–1.0)
pH, UA: 6.5 (ref 5.0–7.5)

## 2020-08-15 NOTE — Progress Notes (Signed)
Urological Symptom Review  Patient is experiencing the following symptoms: Frequent urination Burning/pain with urination Blood in urine   Review of Systems  Gastrointestinal (upper)  : Negative for upper GI symptoms  Gastrointestinal (lower) : Negative for lower GI symptoms  Constitutional : Negative for symptoms  Skin: Negative for skin symptoms  Eyes: Negative for eye symptoms  Ear/Nose/Throat : Negative for Ear/Nose/Throat symptoms  Hematologic/Lymphatic: Negative for Hematologic/Lymphatic symptoms  Cardiovascular : Negative for cardiovascular symptoms  Respiratory : Negative for respiratory symptoms  Endocrine: Negative for endocrine symptoms  Musculoskeletal: Negative for musculoskeletal symptoms  Neurological: Negative for neurological symptoms  Psychologic: Negative for psychiatric symptoms

## 2023-03-19 ENCOUNTER — Encounter: Payer: Self-pay | Admitting: Neurology

## 2023-03-19 ENCOUNTER — Ambulatory Visit: Payer: Commercial Managed Care - PPO | Admitting: Neurology

## 2023-03-19 VITALS — BP 144/88 | HR 78 | Ht 71.0 in | Wt 215.0 lb

## 2023-03-19 DIAGNOSIS — M25571 Pain in right ankle and joints of right foot: Secondary | ICD-10-CM

## 2023-03-19 DIAGNOSIS — M25572 Pain in left ankle and joints of left foot: Secondary | ICD-10-CM | POA: Diagnosis not present

## 2023-03-19 DIAGNOSIS — R202 Paresthesia of skin: Secondary | ICD-10-CM | POA: Diagnosis not present

## 2023-03-19 NOTE — Progress Notes (Signed)
Chief Complaint  Patient presents with   New Patient (Initial Visit)    Rm15, wife present, NP proficient paper referral for Paresthesia: in between eyes, 1st two fingers left hand, bilateral foot numbness, headaches both sides of temple, joint pain from knees downs bilateral, lower back pain, balance issues  fam fx of MS      ASSESSMENT AND PLAN  Tanner Davis is a 32 y.o. male   Paresthesia  Family history of multiple sclerosis, worried about possibility of multiple sclerosis  Proceed with MRI of the brain  EMG nerve conduction study  DIAGNOSTIC DATA (LABS, IMAGING, TESTING) - I reviewed patient records, labs, notes, testing and imaging myself where available.   MEDICAL HISTORY:  Tanner Davis is a 32 year old male, seen in request by his primary care from [facility] Dr. Sherril Croon, [place] for evaluation of constellation of complaints, worried about the possibility of multiple sclerosis, accompanied by his wife at today's visit March 19, 2023  History is obtained from the patient and review of electronic medical records. I personally reviewed pertinent available imaging films in PACS.   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  PHYSICAL EXAMNIATION:  Gen: NAD, conversant, well nourised, well groomed                      Cardiovascular: Regular rate rhythm, no peripheral edema, warm, nontender. Eyes: Conjunctivae clear without exudates or hemorrhage Neck: Supple, no carotid bruits. Pulmonary: Clear to auscultation bilaterally   NEUROLOGICAL EXAM:  MENTAL STATUS: Speech/cognition: Awake, alert, oriented to history taking and casual conversation CRANIAL NERVES: CN II: Visual fields are full to confrontation. Pupils are round equal and briskly reactive to light. CN III, IV, VI: extraocular movement are normal. No ptosis. CN V: Facial sensation is intact to light touch CN VII: Face is symmetric with normal eye closure  CN VIII: Hearing is normal to causal conversation. CN IX, X: Phonation is normal. CN XI: Head turning and shoulder shrug are intact  MOTOR: There is no pronator drift of out-stretched arms. Muscle bulk and tone are normal. Muscle strength is normal.  REFLEXES: Reflexes are 2+ and symmetric at the biceps, triceps, knees, and ankles. Plantar responses are flexor.  SENSORY: Intact to light touch, pinprick and vibratory sensation are intact in fingers and toes.  COORDINATION: There is no trunk or limb dysmetria noted.  GAIT/STANCE: Posture is normal. Gait is steady with normal steps, base, arm swing, and turning. Heel and toe walking are normal. Tandem gait is normal.  Romberg is absent.  REVIEW OF SYSTEMS:  Full 14 system review of systems performed and notable only for as above All other review of systems were negative.   ALLERGIES: Allergies  Allergen Reactions   Shellfish Allergy     HOME MEDICATIONS: Current Outpatient Medications  Medication Sig Dispense Refill   buPROPion (WELLBUTRIN XL) 150 MG 24 hr tablet Take 150 mg by mouth at bedtime.     cetirizine (ZYRTEC) 10 MG tablet Take 10 mg by mouth daily.     Coenzyme Q10 (COQ10) 200 MG CAPS Take 1 tablet by mouth daily.     ibuprofen (ADVIL,MOTRIN) 600 MG tablet Take 1 tablet (600 mg total) by mouth  every 6 (six) hours as needed. 30 tablet 0   pravastatin (PRAVACHOL) 20 MG tablet Take 20 mg by mouth daily.     vitamin D3 (CHOLECALCIFEROL) 25 MCG tablet Take 1,000 Units by mouth daily.     No current facility-administered medications for this visit.    PAST MEDICAL HISTORY: Past Medical History:  Diagnosis Date   Acid reflux    Anxiety    Growing pains    Hypoglycemia    Kidney stone     PAST SURGICAL HISTORY: Past Surgical History:  Procedure Laterality Date   VASECTOMY      FAMILY HISTORY: Family History  Problem Relation Age of Onset   Diabetes Father    Stroke Father    Multiple sclerosis Father    Cancer Maternal Grandfather    Dementia Paternal Grandmother    Cancer Other    Diabetes Other     SOCIAL HISTORY: Social History   Socioeconomic History   Marital status: Married    Spouse name: christina   Number of children: 1   Years of education: Not on file   Highest education level: 12th grade  Occupational History   Occupation: Curator  Tobacco Use   Smoking status: Never   Smokeless tobacco: Never  Vaping Use   Vaping status: Never Used  Substance and Sexual Activity   Alcohol use: Yes    Alcohol/week: 1.0 standard drink of alcohol    Types: 1 Cans of beer per week    Comment: occ   Drug use: No   Sexual activity: Yes    Birth control/protection: Surgical  Other Topics Concern   Not on file  Social History Narrative   Not on file   Social Determinants of Health   Financial Resource Strain: Not on file  Food Insecurity: Not on file  Transportation Needs: Not on file  Physical Activity: Not on file  Stress: Not on file  Social Connections: Not on file  Intimate Partner Violence: Not on file      Levert Feinstein, M.D. Ph.D.  [facility] [address] [place], [place] [address] Ph: [phone] Fax: [phone]  CC:  Orlene Plum, NP [address] [place],  [place] [address]  Ignatius Specking, MD

## 2023-03-20 LAB — RPR: RPR Ser Ql: NONREACTIVE

## 2023-03-20 LAB — COMPREHENSIVE METABOLIC PANEL
ALT: 63 [IU]/L — ABNORMAL HIGH (ref 0–44)
AST: 35 [IU]/L (ref 0–40)
Albumin: 4.7 g/dL (ref 4.1–5.1)
Alkaline Phosphatase: 94 [IU]/L (ref 44–121)
BUN/Creatinine Ratio: 12 (ref 9–20)
BUN: 13 mg/dL (ref 6–20)
Bilirubin Total: 0.6 mg/dL (ref 0.0–1.2)
CO2: 24 mmol/L (ref 20–29)
Calcium: 9.7 mg/dL (ref 8.7–10.2)
Chloride: 102 mmol/L (ref 96–106)
Creatinine, Ser: 1.11 mg/dL (ref 0.76–1.27)
Globulin, Total: 2.6 g/dL (ref 1.5–4.5)
Glucose: 103 mg/dL — ABNORMAL HIGH (ref 70–99)
Potassium: 4.6 mmol/L (ref 3.5–5.2)
Sodium: 139 mmol/L (ref 134–144)
Total Protein: 7.3 g/dL (ref 6.0–8.5)
eGFR: 90 mL/min/{1.73_m2} (ref >59)

## 2023-03-20 LAB — LIPID PANEL
Chol/HDL Ratio: 5.7 {ratio} — ABNORMAL HIGH (ref 0.0–5.0)
Cholesterol, Total: 201 mg/dL — ABNORMAL HIGH (ref 100–199)
HDL: 35 mg/dL — ABNORMAL LOW (ref >39)
LDL Chol Calc (NIH): 120 mg/dL — ABNORMAL HIGH (ref 0–99)
Triglycerides: 261 mg/dL — ABNORMAL HIGH (ref 0–149)
VLDL Cholesterol Cal: 46 mg/dL — ABNORMAL HIGH (ref 5–40)

## 2023-03-20 LAB — CBC WITH DIFFERENTIAL/PLATELET
Basophils Absolute: 0.1 10*3/uL (ref 0.0–0.2)
Basos: 1 %
EOS (ABSOLUTE): 0.1 10*3/uL (ref 0.0–0.4)
Eos: 1 %
Hematocrit: 48.5 % (ref 37.5–51.0)
Hemoglobin: 16.2 g/dL (ref 13.0–17.7)
Immature Grans (Abs): 0 10*3/uL (ref 0.0–0.1)
Immature Granulocytes: 0 %
Lymphocytes Absolute: 2 10*3/uL (ref 0.7–3.1)
Lymphs: 29 %
MCH: 32.5 pg (ref 26.6–33.0)
MCHC: 33.4 g/dL (ref 31.5–35.7)
MCV: 97 fL (ref 79–97)
Monocytes Absolute: 0.6 10*3/uL (ref 0.1–0.9)
Monocytes: 9 %
Neutrophils Absolute: 4 10*3/uL (ref 1.4–7.0)
Neutrophils: 60 %
Platelets: 319 10*3/uL (ref 150–450)
RBC: 4.98 x10E6/uL (ref 4.14–5.80)
RDW: 12.2 % (ref 11.6–15.4)
WBC: 6.7 10*3/uL (ref 3.4–10.8)

## 2023-03-20 LAB — VITAMIN B12: Vitamin B-12: 392 pg/mL (ref 232–1245)

## 2023-03-20 LAB — CK: Total CK: 322 U/L (ref 49–439)

## 2023-03-20 LAB — TSH: TSH: 0.905 u[IU]/mL (ref 0.450–4.500)

## 2023-03-20 LAB — FOLATE: Folate: 9.8 ng/mL (ref >3.0)

## 2023-03-20 LAB — C-REACTIVE PROTEIN: CRP: 1 mg/L (ref 0–10)

## 2023-04-23 ENCOUNTER — Encounter: Payer: Commercial Managed Care - PPO | Admitting: Neurology

## 2023-04-29 ENCOUNTER — Ambulatory Visit: Payer: Commercial Managed Care - PPO | Admitting: Neurology
